# Patient Record
Sex: Female | Born: 1970 | Race: White | Hispanic: No | Marital: Married | Smoking: Never smoker
Health system: Southern US, Community
[De-identification: ages and names within clinical notes are randomized; demographics above are authoritative.]

## PROBLEM LIST (undated history)

## (undated) DIAGNOSIS — K900 Celiac disease: Secondary | ICD-10-CM

## (undated) DIAGNOSIS — G43909 Migraine, unspecified, not intractable, without status migrainosus: Secondary | ICD-10-CM

## (undated) DIAGNOSIS — R42 Dizziness and giddiness: Secondary | ICD-10-CM

## (undated) HISTORY — PX: ABDOMINAL HYSTERECTOMY: SHX81

## (undated) HISTORY — PX: TONSILLECTOMY: SUR1361

---

## 2006-02-28 ENCOUNTER — Inpatient Hospital Stay (HOSPITAL_COMMUNITY): Admission: RE | Admit: 2006-02-28 | Discharge: 2006-03-02 | Payer: Self-pay | Admitting: Obstetrics & Gynecology

## 2007-01-18 ENCOUNTER — Emergency Department: Payer: Self-pay | Admitting: Emergency Medicine

## 2007-01-22 ENCOUNTER — Ambulatory Visit: Payer: Self-pay | Admitting: Internal Medicine

## 2007-01-30 ENCOUNTER — Emergency Department: Payer: Self-pay | Admitting: Emergency Medicine

## 2007-05-30 ENCOUNTER — Encounter: Payer: Self-pay | Admitting: Orthopaedic Surgery

## 2007-06-17 ENCOUNTER — Encounter: Payer: Self-pay | Admitting: Orthopaedic Surgery

## 2007-07-18 ENCOUNTER — Encounter: Payer: Self-pay | Admitting: Orthopaedic Surgery

## 2010-07-06 ENCOUNTER — Ambulatory Visit: Payer: Self-pay | Admitting: Family Medicine

## 2010-10-07 ENCOUNTER — Ambulatory Visit: Payer: Self-pay | Admitting: Otolaryngology

## 2010-12-10 ENCOUNTER — Emergency Department: Payer: Self-pay | Admitting: Internal Medicine

## 2012-09-03 ENCOUNTER — Ambulatory Visit: Payer: Self-pay | Admitting: Family Medicine

## 2018-03-26 ENCOUNTER — Encounter: Payer: Self-pay | Admitting: *Deleted

## 2018-03-26 ENCOUNTER — Other Ambulatory Visit: Payer: Self-pay

## 2018-04-01 NOTE — Discharge Instructions (Signed)
[facility] °[facility] °ENDOSCOPIC SINUS SURGERY °[facility] ° °What is Functional Endoscopic Sinus Surgery? ° The Surgery involves making the natural openings of the sinuses larger by removing the bony partitions that separate the sinuses from the nasal cavity.  The natural sinus lining is preserved as much as possible to allow the sinuses to resume normal function after the surgery.  In some patients nasal polyps (excessively swollen lining of the sinuses) may be removed to relieve obstruction of the sinus openings.  The surgery is performed through the nose using lighted scopes, which eliminates the need for incisions on the face.  A septoplasty is a different procedure which is sometimes performed with sinus surgery.  It involves straightening the boy partition that separates the two sides of your nose.  A crooked or deviated septum may need repair if is obstructing the sinuses or nasal airflow.  Turbinate reduction is also often performed during sinus surgery.  The turbinates are bony proturberances from the side walls of the nose which swell and can obstruct the nose in patients with sinus and allergy problems.  Their size can be surgically reduced to help relieve nasal obstruction. ° °What Can Sinus Surgery Do For Me? ° Sinus surgery can reduce the frequency of sinus infections requiring antibiotic treatment.  This can provide improvement in nasal congestion, post-nasal drainage, facial pressure and nasal obstruction.  Surgery will NOT prevent you from ever having an infection again, so it usually only for patients who get infections 4 or more times yearly requiring antibiotics, or for infections that do not clear with antibiotics.  It will not cure nasal allergies, so patients with allergies may still require medication to treat their allergies after surgery. Surgery may improve headaches related to sinusitis, however, some people will continue to  require medication to control sinus headaches related to allergies.  Surgery will do nothing for other forms of headache (migraine, tension or cluster). ° °What Are the Risks of Endoscopic Sinus Surgery? ° Current techniques allow surgery to be performed safely with little risk, however, there are rare complications that patients should be aware of.  Because the sinuses are located around the eyes, there is risk of eye injury, including blindness, though again, this would be quite rare. This is usually a result of bleeding behind the eye during surgery, which puts the vision oat risk, though there are treatments to protect the vision and prevent permanent disrupted by surgery causing a leak of the spinal fluid that surrounds the brain.  More serious complications would include bleeding inside the brain cavity or damage to the brain.  Again, all of these complications are uncommon, and spinal fluid leaks can be safely managed surgically if they occur.  The most common complication of sinus surgery is bleeding from the nose, which may require packing or cauterization of the nose.  Continued sinus have polyps may experience recurrence of the polyps requiring revision surgery.  Alterations of sense of smell or injury to the tear ducts are also rare complications.  ° °What is the Surgery Like, and what is the Recovery? ° The Surgery usually takes a couple of hours to perform, and is usually performed under a general anesthetic (completely asleep).  Patients are usually discharged home after a couple of hours.  Sometimes during surgery it is necessary to pack the nose to control bleeding, and the packing is left in place for 24 - 48 hours, and removed by your surgeon.    If a septoplasty was performed during the procedure, there is often a splint placed which must be removed after 5-7 days.   °Discomfort: Pain is usually mild to moderate, and can be controlled by prescription pain medication or acetaminophen (Tylenol).   Aspirin, Ibuprofen (Advil, Motrin), or Naprosyn (Aleve) should be avoided, as they can cause increased bleeding.  Most patients feel sinus pressure like they have a bad head cold for several days.  Sleeping with your head elevated can help reduce swelling and facial pressure, as can ice packs over the face.  A humidifier may be helpful to keep the mucous and blood from drying in the nose.  ° °Diet: There are no specific diet restrictions, however, you should generally start with clear liquids and a light diet of bland foods because the anesthetic can cause some nausea.  Advance your diet depending on how your stomach feels.  Taking your pain medication with food will often help reduce stomach upset which pain medications can cause. ° °Nasal Saline Irrigation: It is important to remove blood clots and dried mucous from the nose as it is healing.  This is done by having you irrigate the nose at least 3 - 4 times daily with a salt water solution.  We recommend using NeilMed Sinus Rinse (available at the drug store).  Fill the squeeze bottle with the solution, bend over a sink, and insert the tip of the squeeze bottle into the nose ½ of an inch.  Point the tip of the squeeze bottle towards the inside corner of the eye on the same side your irrigating.  Squeeze the bottle and gently irrigate the nose.  If you bend forward as you do this, most of the fluid will flow back out of the nose, instead of down your throat.   The solution should be warm, near body temperature, when you irrigate.   Each time you irrigate, you should use a full squeeze bottle.  ° °Note that if you are instructed to use Nasal Steroid Sprays at any time after your surgery, irrigate with saline BEFORE using the steroid spray, so you do not wash it all out of the nose. °Another product, Nasal Saline Gel (such as AYR Nasal Saline Gel) can be applied in each nostril 3 - 4 times daily to moisture the nose and reduce scabbing or crusting. ° °Bleeding:   Bloody drainage from the nose can be expected for several days, and patients are instructed to irrigate their nose frequently with salt water to help remove mucous and blood clots.  The drainage may be dark red or brown, though some fresh blood may be seen intermittently, especially after irrigation.  Do not blow you nose, as bleeding may occur. If you must sneeze, keep your mouth open to allow air to escape through your mouth. ° °If heavy bleeding occurs: Irrigate the nose with saline to rinse out clots, then spray the nose 3 - 4 times with Afrin Nasal Decongestant Spray.  The spray will constrict the blood vessels to slow bleeding.  Pinch the lower half of your nose shut to apply pressure, and lay down with your head elevated.  Ice packs over the nose may help as well. If bleeding persists despite these measures, you should notify your doctor.  Do not use the Afrin routinely to control nasal congestion after surgery, as it can result in worsening congestion and may affect healing.  ° ° ° °Activity: Return to work varies among patients. Most patients will be   out of work at least 5 - 7 days to recover.  Patient may return to work after they are off of narcotic pain medication, and feeling well enough to perform the functions of their job.  Patients must avoid heavy lifting (over 10 pounds) or strenuous physical for 2 weeks after surgery, so your employer may need to assign you to light duty, or keep you out of work longer if light duty is not possible.  NOTE: you should not drive, operate dangerous machinery, do any mentally demanding tasks or make any important legal or financial decisions while on narcotic pain medication and recovering from the general anesthetic.  °  °Call Your Doctor Immediately if You Have Any of the Following: °1. Bleeding that you cannot control with the above measures °2. Loss of vision, double vision, bulging of the eye or black eyes. °3. Fever over 101 degrees °4. Neck stiffness with  severe headache, fever, nausea and change in mental state. °You are always encourage to call anytime with concerns, however, please call with requests for pain medication refills during office hours. ° °Office Endoscopy: During follow-up visits your doctor will remove any packing or splints that may have been placed and evaluate and clean your sinuses endoscopically.  Topical anesthetic will be used to make this as comfortable as possible, though you may want to take your pain medication prior to the visit.  How often this will need to be done varies from patient to patient.  After complete recovery from the surgery, you may need follow-up endoscopy from time to time, particularly if there is concern of recurrent infection or nasal polyps. ° ° °General Anesthesia, Adult, Care After °These instructions provide you with information about caring for yourself after your procedure. Your health care provider may also give you more specific instructions. Your treatment has been planned according to current medical practices, but problems sometimes occur. Call your health care provider if you have any problems or questions after your procedure. °What can I expect after the procedure? °After the procedure, it is common to have: °· Vomiting. °· A sore throat. °· Mental slowness. ° °It is common to feel: °· Nauseous. °· Cold or shivery. °· Sleepy. °· Tired. °· Sore or achy, even in parts of your body where you did not have surgery. ° °Follow these instructions at home: °For at least 24 hours after the procedure: °· Do not: °? Participate in activities where you could fall or become injured. °? Drive. °? Use heavy machinery. °? Drink alcohol. °? Take sleeping pills or medicines that cause drowsiness. °? Make important decisions or sign legal documents. °? Take care of children on your own. °· Rest. °Eating and drinking °· If you vomit, drink water, juice, or soup when you can drink without vomiting. °· Drink enough fluid to  keep your urine clear or pale yellow. °· Make sure you have little or no nausea before eating solid foods. °· Follow the diet recommended by your health care provider. °General instructions °· Have a responsible adult stay with you until you are awake and alert. °· Return to your normal activities as told by your health care provider. Ask your health care provider what activities are safe for you. °· Take over-the-counter and prescription medicines only as told by your health care provider. °· If you smoke, do not smoke without supervision. °· Keep all follow-up visits as told by your health care provider. This is important. °Contact a health care provider if: °· You   continue to have nausea or vomiting at home, and medicines are not helpful. °· You cannot drink fluids or start eating again. °· You cannot urinate after 8-12 hours. °· You develop a skin rash. °· You have fever. °· You have increasing redness at the site of your procedure. °Get help right away if: °· You have difficulty breathing. °· You have chest pain. °· You have unexpected bleeding. °· You feel that you are having a life-threatening or urgent problem. °This information is not intended to replace advice given to you by your health care provider. Make sure you discuss any questions you have with your health care provider. °Document Released: 10/09/2000 Document Revised: 12/06/2015 Document Reviewed: 06/17/2015 °Elsevier Interactive Patient Education © 2018 [employer] ° °

## 2018-04-02 ENCOUNTER — Encounter
Admission: RE | Disposition: A | Discharge status: Home / Self Care | Payer: Self-pay | Source: Ambulatory Visit | Attending: Otolaryngology

## 2018-04-02 ENCOUNTER — Ambulatory Visit
Admission: RE | Admit: 2018-04-02 | Discharge: 2018-04-02 | Disposition: A | Discharge status: Home / Self Care | Payer: Managed Care, Other (non HMO) | Source: Ambulatory Visit | Attending: Otolaryngology | Admitting: Otolaryngology

## 2018-04-02 ENCOUNTER — Ambulatory Visit: Payer: Managed Care, Other (non HMO) | Admitting: Anesthesiology

## 2018-04-02 DIAGNOSIS — Z79899 Other long term (current) drug therapy: Secondary | ICD-10-CM | POA: Insufficient documentation

## 2018-04-02 DIAGNOSIS — K900 Celiac disease: Secondary | ICD-10-CM | POA: Insufficient documentation

## 2018-04-02 DIAGNOSIS — K219 Gastro-esophageal reflux disease without esophagitis: Secondary | ICD-10-CM | POA: Diagnosis not present

## 2018-04-02 DIAGNOSIS — E7800 Pure hypercholesterolemia, unspecified: Secondary | ICD-10-CM | POA: Insufficient documentation

## 2018-04-02 DIAGNOSIS — J328 Other chronic sinusitis: Secondary | ICD-10-CM | POA: Diagnosis not present

## 2018-04-02 DIAGNOSIS — B957 Other staphylococcus as the cause of diseases classified elsewhere: Secondary | ICD-10-CM | POA: Insufficient documentation

## 2018-04-02 HISTORY — PX: IMAGE GUIDED SINUS SURGERY: SHX6570

## 2018-04-02 HISTORY — DX: Dizziness and giddiness: R42

## 2018-04-02 HISTORY — DX: Migraine, unspecified, not intractable, without status migrainosus: G43.909

## 2018-04-02 HISTORY — DX: Celiac disease: K90.0

## 2018-04-02 HISTORY — PX: SEPTOPLASTY WITH ETHMOIDECTOMY, AND MAXILLARY ANTROSTOMY: SHX6090

## 2018-04-02 SURGERY — SINUS SURGERY, WITH IMAGING GUIDANCE
Anesthesia: General | Site: Nose | Laterality: Right

## 2018-04-02 MED ORDER — NEOSTIGMINE METHYLSULFATE 10 MG/10ML IV SOLN
INTRAVENOUS | Status: DC | PRN
  Administered 2018-04-02: 3 mg via INTRAVENOUS

## 2018-04-02 MED ORDER — ROCURONIUM BROMIDE 100 MG/10ML IV SOLN
INTRAVENOUS | Status: DC | PRN
  Administered 2018-04-02: 15 mg via INTRAVENOUS

## 2018-04-02 MED ORDER — OXYCODONE HCL 5 MG PO TABS
5.0000 mg | ORAL_TABLET | Freq: Once | ORAL | Status: AC | PRN
  Administered 2018-04-02: 5 mg via ORAL

## 2018-04-02 MED ORDER — LIDOCAINE-EPINEPHRINE 1 %-1:100000 IJ SOLN
INTRAMUSCULAR | Status: DC | PRN
  Administered 2018-04-02: 7 mL

## 2018-04-02 MED ORDER — PROPOFOL 10 MG/ML IV BOLUS
INTRAVENOUS | Status: DC | PRN
  Administered 2018-04-02: 150 mg via INTRAVENOUS

## 2018-04-02 MED ORDER — LIDOCAINE HCL (CARDIAC) PF 100 MG/5ML IV SOSY
PREFILLED_SYRINGE | INTRAVENOUS | Status: DC | PRN
  Administered 2018-04-02: 40 mg via INTRAVENOUS

## 2018-04-02 MED ORDER — DEXAMETHASONE SODIUM PHOSPHATE 4 MG/ML IJ SOLN
INTRAMUSCULAR | Status: DC | PRN
  Administered 2018-04-02: 4 mg via INTRAVENOUS

## 2018-04-02 MED ORDER — ONDANSETRON HCL 4 MG/2ML IJ SOLN
INTRAMUSCULAR | Status: DC | PRN
  Administered 2018-04-02: 4 mg via INTRAVENOUS

## 2018-04-02 MED ORDER — DEXMEDETOMIDINE HCL 200 MCG/2ML IV SOLN
INTRAVENOUS | Status: DC | PRN
  Administered 2018-04-02 (×2): 4 ug via INTRAVENOUS

## 2018-04-02 MED ORDER — SUCCINYLCHOLINE CHLORIDE 20 MG/ML IJ SOLN
INTRAMUSCULAR | Status: DC | PRN
  Administered 2018-04-02: 100 mg via INTRAVENOUS

## 2018-04-02 MED ORDER — ACETAMINOPHEN 160 MG/5ML PO SOLN: 325.0000 mg | ORAL | Status: DC | PRN

## 2018-04-02 MED ORDER — MIDAZOLAM HCL 5 MG/5ML IJ SOLN
INTRAMUSCULAR | Status: DC | PRN
  Administered 2018-04-02: 2 mg via INTRAVENOUS

## 2018-04-02 MED ORDER — OXYMETAZOLINE HCL 0.05 % NA SOLN
NASAL | Status: DC | PRN
  Administered 2018-04-02: 1 via TOPICAL

## 2018-04-02 MED ORDER — ACETAMINOPHEN 325 MG PO TABS: 325.0000 mg | ORAL_TABLET | ORAL | Status: DC | PRN

## 2018-04-02 MED ORDER — GLYCOPYRROLATE 0.2 MG/ML IJ SOLN
INTRAMUSCULAR | Status: DC | PRN
  Administered 2018-04-02: 0.4 mg via INTRAVENOUS
  Administered 2018-04-02: 0.2 mg via INTRAVENOUS

## 2018-04-02 MED ORDER — FENTANYL CITRATE (PF) 100 MCG/2ML IJ SOLN
INTRAMUSCULAR | Status: DC | PRN
  Administered 2018-04-02: 100 ug via INTRAVENOUS

## 2018-04-02 MED ORDER — OXYCODONE HCL 5 MG/5ML PO SOLN: 5.0000 mg | Freq: Once | ORAL | Status: AC | PRN

## 2018-04-02 MED ORDER — PROMETHAZINE HCL 25 MG/ML IJ SOLN
6.2500 mg | INTRAMUSCULAR | Status: DC | PRN
  Administered 2018-04-02: 6.25 mg via INTRAVENOUS

## 2018-04-02 MED ORDER — ACETAMINOPHEN 10 MG/ML IV SOLN
1000.0000 mg | Freq: Once | INTRAVENOUS | Status: AC
  Administered 2018-04-02: 1000 mg via INTRAVENOUS

## 2018-04-02 MED ORDER — FENTANYL CITRATE (PF) 100 MCG/2ML IJ SOLN: 25.0000 ug | INTRAMUSCULAR | Status: DC | PRN

## 2018-04-02 MED ORDER — HYDROCODONE-ACETAMINOPHEN 5-325 MG PO TABS: 1.0000 | ORAL_TABLET | Freq: Four times a day (QID) | ORAL | 0 refills | Status: DC | PRN

## 2018-04-02 MED ORDER — LACTATED RINGERS IV SOLN
10.0000 mL/h | INTRAVENOUS | Status: DC
  Administered 2018-04-02: 09:00:00 via INTRAVENOUS
  Administered 2018-04-02: 10 mL/h via INTRAVENOUS
  Administered 2018-04-02: 08:00:00 via INTRAVENOUS

## 2018-04-02 SURGICAL SUPPLY — 21 items
BATTERY INSTRU NAVIGATION (MISCELLANEOUS) ×14 IMPLANT
BTRY SRG DRVR LF (MISCELLANEOUS) ×6
CANISTER SUCT 1200ML W/VALVE (MISCELLANEOUS) ×4 IMPLANT
COAG SUCT 10F 3.5MM HAND CTRL (MISCELLANEOUS) ×4 IMPLANT
DRAPE HEAD BAR (DRAPES) ×4 IMPLANT
ELECT REM PT RETURN 9FT ADLT (ELECTROSURGICAL) ×4
ELECTRODE REM PT RTRN 9FT ADLT (ELECTROSURGICAL) ×2 IMPLANT
IV NS 500ML (IV SOLUTION) ×4
IV NS 500ML BAXH (IV SOLUTION) ×2 IMPLANT
KIT TURNOVER KIT A (KITS) ×4 IMPLANT
NS IRRIG 500ML POUR BTL (IV SOLUTION) ×4 IMPLANT
PACK DRAPE NASAL/ENT (PACKS) ×4 IMPLANT
PACKING NASAL EPIS 4X2.4 XEROG (MISCELLANEOUS) ×2 IMPLANT
PATTIES SURGICAL .5 X3 (DISPOSABLE) ×4 IMPLANT
SHAVER DIEGO BLD STD TYPE A (BLADE) ×4 IMPLANT
SOL ANTI-FOG 6CC FOG-OUT (MISCELLANEOUS) ×2 IMPLANT
SOL FOG-OUT ANTI-FOG 6CC (MISCELLANEOUS) ×2
SYR 10ML LL (SYRINGE) ×4 IMPLANT
TRACKER CRANIALMASK (MASK) ×4 IMPLANT
TUBING DECLOG MULTIDEBRIDER (TUBING) ×4 IMPLANT
WATER STERILE IRR 500ML POUR (IV SOLUTION) IMPLANT

## 2018-04-02 NOTE — Anesthesia Postprocedure Evaluation (Signed)
Anesthesia Post Note  Patient: Toni Flores  Procedure(s) Performed: IMAGE GUIDED SINUS SURGERY (Right ) RIGHT  ETHMOIDECTOMY, AND MAXILLARY ANTROSTOMY WITH TISSUE AND RIGHT FRONTAL SINUSOTMOMY (Right Nose)  Patient location during evaluation: PACU Anesthesia Type: General Level of consciousness: awake Pain management: pain level controlled Vital Signs Assessment: post-procedure vital signs reviewed and stable Respiratory status: respiratory function stable Cardiovascular status: stable Postop Assessment: no signs of nausea or vomiting Anesthetic complications: no    Jola BabinskiElsje Arkin Imran

## 2018-04-02 NOTE — Op Note (Signed)
04/02/2018  9:52 AM    Toni Flores  [id number]   Pre-Op Diagnosis:  RIGHT CHRONIC SINUSITIS  Post-op Diagnosis: RIGHT CHRONIC SINUSITIS  Procedure:  1)  Image Guided Sinus Surgery,   2)  Right Endoscopic Maxillary Antrostomy with Tissue Removal   3)  Right Frontal Sinusotomy   4)  Right anterior Ethmoidectomy      Surgeon:  Toni Flores  Anesthesia:  General endotracheal  EBL:  75cc  Complications:  None  Findings: Purulence in the right maxillary sinus with mucosal thickening in the anterior ethmoids and frontal recess  Procedure: After the patient was identified in holding and the benefits of the procedure were reviewed as well as the consent and risks, the patient was taken to the operating room and with the patient in a comfortable supine position,  general orotracheal anesthesia was induced without difficulty.  A proper time-out was performed.  The Stryker image guidance system was set up and calibrated in the normal fashion and felt to be acceptable.  Next 1% Xylocaine with 1:100,000 epinephrine was infiltrated into the uncinate region and  anterior middle turbinate on the right.  Several minutes were allowed for this to take effect.  Cottoniod pledgets soaked in Afrin were placed into both nasal cavities and left while the patient was prepped and draped in the standard fashion. The image guided suction was calibrated and used to inspect known points in the nasal cavity to assess accuracy of the image guided system. Accuracy was felt to be excellent.   The right middle turbinate was noted to be widened and on careful inspection of the CT there appeared to be a small opacified concha bullosa. This was opened with a sickle knife and the lateral wall excised with endoscopic scissors and use of the microdebrider. The middle turbinate was then medialized with a Freer and the uncinate process then resected with through-cutting forceps as well as the microdebrider. In this  fashion the uncinate was completely removed along with soft tissue and bone of the medial wall of the maxillary sinus to create a large patent maxillary antrostomy. The left maxillary sinus was suctioned to clear purulent secretions. Cultures were taken.  Next the right anterior ethmoid sinuses were dissected beginning inferomedially, entering the ethmoid bulla. Thru cut forceps were used to open the anterior ethmoids. The microdebrider was used as needed to trim loose mucosal edges.  Dissection proceeded anteriorly and superiorly into the right frontal recess which was dissected utilizing a 30 and then a 70 scope and frontal curved instruments. The curved image guided suction was used during this dissection to frequently reassess the anatomy on the CT scan. The frontal recess was dissected until a suction could be passed up into the region of the frontal sinus.   The nose was suctioned and inspected. The maxillary sinus was irrigated with saline. Xerogel absorbable sinus packing was then placed in the right ethmoid cavity.   The patient was then returned to the anesthesiologist for awakening and taken to recovery room in good condition postoperatively.  Disposition:   PACU and d/c home  Plan: Ice, elevation, narcotic analgesia and prophylactic antibiotics. Begin sinus irrigations with saline tomrrow, irrigating 3-4 times daily. Return to the office in 7 days.  Return to work in 7-10 days, no strenuous activities for two weeks.   Toni Flores 04/02/2018 9:52 AM

## 2018-04-02 NOTE — Anesthesia Procedure Notes (Addendum)
Procedure Name: Intubation Date/Time: 04/02/2018 8:18 AM Performed by: Lind Guest, CRNA Pre-anesthesia Checklist: Patient identified, Emergency Drugs available, Suction available, Patient being monitored and Timeout performed Patient Re-evaluated:Patient Re-evaluated prior to induction Oxygen Delivery Method: Circle system utilized Preoxygenation: Pre-oxygenation with 100% oxygen Induction Type: IV induction Ventilation: Mask ventilation without difficulty Laryngoscope Size: Mac and 3 Grade View: Grade I Tube type: Oral Rae Tube size: 7.0 mm Number of attempts: 1 Airway Equipment and Method: LTA kit utilized Placement Confirmation: ETT inserted through vocal cords under direct vision,  positive ETCO2 and breath sounds checked- equal and bilateral Tube secured with: Tape Dental Injury: Teeth and Oropharynx as per pre-operative assessment

## 2018-04-02 NOTE — Anesthesia Preprocedure Evaluation (Addendum)
Anesthesia Evaluation  Patient identified by MRN, date of birth, ID band Patient awake    Reviewed: Allergy & Precautions, NPO status , Patient's Chart, lab work & pertinent test results  Airway Mallampati: II  TM Distance: >3 FB     Dental no notable dental hx.    Pulmonary neg recent URI,    breath sounds clear to auscultation       Cardiovascular negative cardio ROS   Rhythm:Regular Rate:Normal     Neuro/Psych  Headaches,    GI/Hepatic Celiac disease   Endo/Other  negative endocrine ROS  Renal/GU      Musculoskeletal   Abdominal   Peds  Hematology   Anesthesia Other Findings Chronic sinitis  Reproductive/Obstetrics                            Anesthesia Physical Anesthesia Plan  ASA: II  Anesthesia Plan: General   Post-op Pain Management:    Induction: Intravenous  PONV Risk Score and Plan: 3 and Ondansetron, Dexamethasone and Midazolam  Airway Management Planned: Oral ETT  Additional Equipment:   Intra-op Plan:   Post-operative Plan:   Informed Consent: I have reviewed the patients History and Physical, chart, labs and discussed the procedure including the risks, benefits and alternatives for the proposed anesthesia with the patient or authorized representative who has indicated his/her understanding and acceptance.   Dental advisory given  Plan Discussed with: CRNA  Anesthesia Plan Comments:         Anesthesia Quick Evaluation

## 2018-04-02 NOTE — Transfer of Care (Signed)
Immediate Anesthesia Transfer of Care Note  Patient: Toni Flores  Procedure(s) Performed: IMAGE GUIDED SINUS SURGERY (Right ) RIGHT  ETHMOIDECTOMY, AND MAXILLARY ANTROSTOMY WITH TISSUE AND RIGHT FRONTAL SINUSOTMOMY (Right Nose)  Patient Location: PACU  Anesthesia Type: General  Level of Consciousness: awake, alert  and patient cooperative  Airway and Oxygen Therapy: Patient Spontanous Breathing and Patient connected to supplemental oxygen  Post-op Assessment: Post-op Vital signs reviewed, Patient's Cardiovascular Status Stable, Respiratory Function Stable, Patent Airway and No signs of Nausea or vomiting  Post-op Vital Signs: Reviewed and stable  Complications: No apparent anesthesia complications

## 2018-04-02 NOTE — H&P (Signed)
History and physical reviewed and will be scanned in later. No change in medical status reported by the patient or family, appears stable for surgery. All questions regarding the procedure answered, and patient (or family if a child) expressed understanding of the procedure. ? ?Toni Flores ?@TODAY@ ?

## 2018-04-03 ENCOUNTER — Encounter: Payer: Self-pay | Admitting: Otolaryngology

## 2018-04-06 LAB — AEROBIC/ANAEROBIC CULTURE (SURGICAL/DEEP WOUND)

## 2018-04-06 LAB — AEROBIC/ANAEROBIC CULTURE W GRAM STAIN (SURGICAL/DEEP WOUND)

## 2018-04-06 LAB — SURGICAL PATHOLOGY

## 2019-03-11 ENCOUNTER — Other Ambulatory Visit: Payer: Self-pay | Admitting: Student

## 2019-03-11 DIAGNOSIS — R1013 Epigastric pain: Secondary | ICD-10-CM

## 2019-03-20 ENCOUNTER — Encounter
Admission: RE | Admit: 2019-03-20 | Discharge: 2019-03-20 | Disposition: A | Discharge status: Home / Self Care | Payer: Managed Care, Other (non HMO) | Source: Ambulatory Visit | Attending: Student | Admitting: Student

## 2019-03-20 ENCOUNTER — Other Ambulatory Visit: Payer: Self-pay

## 2019-03-20 DIAGNOSIS — R1013 Epigastric pain: Secondary | ICD-10-CM | POA: Diagnosis not present

## 2019-03-20 MED ORDER — TECHNETIUM TC 99M MEBROFENIN IV KIT
5.3800 | PACK | Freq: Once | INTRAVENOUS | Status: AC | PRN
  Administered 2019-03-20: 09:00:00 5.38 via INTRAVENOUS

## 2019-08-19 ENCOUNTER — Other Ambulatory Visit: Payer: Self-pay | Admitting: Student

## 2019-08-19 DIAGNOSIS — R1013 Epigastric pain: Secondary | ICD-10-CM

## 2019-08-19 DIAGNOSIS — R109 Unspecified abdominal pain: Secondary | ICD-10-CM

## 2019-08-19 DIAGNOSIS — R6881 Early satiety: Secondary | ICD-10-CM

## 2020-09-13 ENCOUNTER — Telehealth: Payer: Self-pay

## 2020-09-13 NOTE — Telephone Encounter (Signed)
Attempted to call and schedule ENT REFERRAL. No answer had to leave vm.

## 2020-09-16 ENCOUNTER — Other Ambulatory Visit: Payer: Self-pay

## 2020-09-16 ENCOUNTER — Encounter: Payer: Self-pay | Admitting: Infectious Diseases

## 2020-09-16 ENCOUNTER — Ambulatory Visit: Payer: Managed Care, Other (non HMO) | Attending: Infectious Diseases | Admitting: Infectious Diseases

## 2020-09-16 ENCOUNTER — Other Ambulatory Visit
Admission: RE | Admit: 2020-09-16 | Discharge: 2020-09-16 | Disposition: A | Discharge status: Home / Self Care | Payer: Managed Care, Other (non HMO) | Attending: Infectious Diseases | Admitting: Infectious Diseases

## 2020-09-16 VITALS — BP 117/78 | HR 98 | Temp 97.3°F | Resp 16 | Ht 60.0 in | Wt 142.0 lb

## 2020-09-16 DIAGNOSIS — J0100 Acute maxillary sinusitis, unspecified: Secondary | ICD-10-CM | POA: Diagnosis present

## 2020-09-16 DIAGNOSIS — Z79899 Other long term (current) drug therapy: Secondary | ICD-10-CM | POA: Insufficient documentation

## 2020-09-16 DIAGNOSIS — K8681 Exocrine pancreatic insufficiency: Secondary | ICD-10-CM | POA: Insufficient documentation

## 2020-09-16 DIAGNOSIS — K900 Celiac disease: Secondary | ICD-10-CM | POA: Insufficient documentation

## 2020-09-16 DIAGNOSIS — Z8719 Personal history of other diseases of the digestive system: Secondary | ICD-10-CM | POA: Diagnosis not present

## 2020-09-16 DIAGNOSIS — R1110 Vomiting, unspecified: Secondary | ICD-10-CM | POA: Diagnosis not present

## 2020-09-16 DIAGNOSIS — Z881 Allergy status to other antibiotic agents status: Secondary | ICD-10-CM | POA: Diagnosis not present

## 2020-09-16 DIAGNOSIS — Z8616 Personal history of COVID-19: Secondary | ICD-10-CM | POA: Diagnosis not present

## 2020-09-16 LAB — SEDIMENTATION RATE: Sed Rate: 15 mm/hr (ref 0–30)

## 2020-09-16 LAB — COMPREHENSIVE METABOLIC PANEL
ALT: 11 U/L (ref 0–44)
AST: 18 U/L (ref 15–41)
Albumin: 4.1 g/dL (ref 3.5–5.0)
Alkaline Phosphatase: 54 U/L (ref 38–126)
Anion gap: 6 (ref 5–15)
BUN: 10 mg/dL (ref 6–20)
CO2: 20 mmol/L — ABNORMAL LOW (ref 22–32)
Calcium: 8.9 mg/dL (ref 8.9–10.3)
Chloride: 114 mmol/L — ABNORMAL HIGH (ref 98–111)
Creatinine, Ser: 0.71 mg/dL (ref 0.44–1.00)
GFR, Estimated: >60 mL/min (ref >60)
Glucose, Bld: 98 mg/dL (ref 70–99)
Potassium: 3.4 mmol/L — ABNORMAL LOW (ref 3.5–5.1)
Sodium: 140 mmol/L (ref 135–145)
Total Bilirubin: 0.3 mg/dL (ref 0.3–1.2)
Total Protein: 7.4 g/dL (ref 6.5–8.1)

## 2020-09-16 LAB — CBC WITH DIFFERENTIAL/PLATELET
Abs Immature Granulocytes: 0.01 10*3/uL (ref 0.00–0.07)
Basophils Absolute: 0 10*3/uL (ref 0.0–0.1)
Basophils Relative: 1 %
Eosinophils Absolute: 0.2 10*3/uL (ref 0.0–0.5)
Eosinophils Relative: 5 %
HCT: 36.5 % (ref 36.0–46.0)
Hemoglobin: 11.7 g/dL — ABNORMAL LOW (ref 12.0–15.0)
Immature Granulocytes: 0 %
Lymphocytes Relative: 32 %
Lymphs Abs: 1.6 10*3/uL (ref 0.7–4.0)
MCH: 29.7 pg (ref 26.0–34.0)
MCHC: 32.1 g/dL (ref 30.0–36.0)
MCV: 92.6 fL (ref 80.0–100.0)
Monocytes Absolute: 0.4 10*3/uL (ref 0.1–1.0)
Monocytes Relative: 7 %
Neutro Abs: 2.7 10*3/uL (ref 1.7–7.7)
Neutrophils Relative %: 55 %
Platelets: 279 10*3/uL (ref 150–400)
RBC: 3.94 MIL/uL (ref 3.87–5.11)
RDW: 13.7 % (ref 11.5–15.5)
WBC: 4.9 10*3/uL (ref 4.0–10.5)
nRBC: 0 % (ref 0.0–0.2)

## 2020-09-16 LAB — C-REACTIVE PROTEIN: CRP: 0.6 mg/dL (ref <1.0)

## 2020-09-16 NOTE — Patient Instructions (Addendum)
You are here referred by ENT for sinusitis and need for antibiotics-  you have GI issues and cannot take oral antibiotics. Will decide regarding Iv antibiotics after CT scan on Monday. Discussed with Dr.Bennett regarding the plan- will do labs today- ESR/CRP/CBC/CMP. Will discuss with your pcp regarding low IgM

## 2020-09-16 NOTE — Progress Notes (Signed)
NAME: Toni Flores  DOB: Jul 12, 1971  MRN: [id number]  Date/Time: 09/16/2020 12:47 PM  Subjective:   ?Patient is referred by Dr. Richardson Landry for sinusitis of the left side and need for IV antibiotics. Toni Flores is a 50 y.o. female with a history of celiac disease, Barrett's esophagus migraine, low IgM, Covid infection  in 2020 and 2021, right chronic sinusitis s/p right endoscopic maxillary and ostomy, right frontal sinusotomy, right anterior ethmoidectomy on 04/02/2018 presents with left-sided facial pain. As per patient she had COVID in July 2021.  She works as a Marine scientist at [employer]. Post Covid she was not able to go to work for 3 months because she was having frequent sinusitis and bronchitis.  She received 2 courses of steroids.  The last one was in October 2021.  In January 2022 she developed pain in the upper molar on the left side.  She was initially Flores amoxicillin and ibuprofen.  3 days into the treatment she developed vomiting with blood in it and dark stools.   And then the tooth was extracted in February 2022.  Since then she has had pain on the left side of her face especially the maxillary area. She also seems to have postnasal drip. At times greenish discharge from the nasal cavity. She saw ENT Dr. Richardson Landry and underwent a culture which was only staph epidermidis cyst skin organism.  Because of her intolerance to oral antibiotics she was Flores 3 doses of ceftriaxone.  She is referred to me for further management.  A CT scan is being requested. She saw GI Dr. Alice Toni Flores on 09/08/2020 and she is on Carafate, proton pump inhibitor, Zofran, Phenergan. Her sister who was in her 60s had CVID and she passed away due to Covid last year She also has low IgM level.  Her PCP Dr. Sharol Flores has been checking her levels. She has no fever    Past Medical History:  Diagnosis Date   Celiac disease    Migraine headache    approx 1x/mo   Vertigo    last episode July 2019    Past Surgical  History:  Procedure Laterality Date   ABDOMINAL HYSTERECTOMY     CESAREAN SECTION     x2   IMAGE GUIDED SINUS SURGERY Right 04/02/2018   Procedure: IMAGE GUIDED SINUS SURGERY;  Surgeon: Clyde Canterbury, MD;  Location: [facility];  Service: ENT;  Laterality: Right;  NEED STRYKER DISK  DISK Flores TO [facility] 9.12.19   SEPTOPLASTY WITH ETHMOIDECTOMY, AND MAXILLARY ANTROSTOMY Right 04/02/2018   Procedure: RIGHT  ETHMOIDECTOMY, AND MAXILLARY ANTROSTOMY WITH TISSUE AND RIGHT FRONTAL SINUSOTMOMY;  Surgeon: Clyde Canterbury, MD;  Location: [facility];  Service: ENT;  Laterality: Right;  Latex sensitivity   TONSILLECTOMY      Social History   Socioeconomic History   Marital status: Married    Spouse name: Not on file   Number of children: Not on file   Years of education: Not on file   Highest education level: Not on file  Occupational History   Not on file  Tobacco Use   Smoking status: Never Smoker   Smokeless tobacco: Never Used  Vaping Use   Vaping Use: Never used  Substance and Sexual Activity   Alcohol use: Yes    Alcohol/week: 2.0 standard drinks    Types: 2 Cans of beer per week   Drug use: Not on file   Sexual activity: Not on file  Other Topics Concern   Not on  file  Social History Narrative   Not on file   Social Determinants of Health   Financial Resource Strain: Not on file  Food Insecurity: Not on file  Transportation Needs: Not on file  Physical Activity: Not on file  Stress: Not on file  Social Connections: Not on file  Intimate Partner Violence: Not on file    No family history on file. Allergies  Allergen Reactions   Orphenadrine Citrate Other (See Comments)    Urinary retention.   Erythromycin Hives   Gluten Meal     GI issues   Keflex [Cephalexin] Hives   Levaquin [Levofloxacin]     Muscle aches   Norflex [Orphenadrine]     Urinary retention   Adhesive [Tape] Rash    (paper tape OK)   Latex Rash    Extended  wear of gloves   I? Current Outpatient Medications  Medication Sig Dispense Refill   acetaZOLAMIDE (DIAMOX) 500 MG capsule Take 1,000 mg by mouth 2 (two) times daily.     cyclobenzaprine (FLEXERIL) 10 MG tablet Take 10 mg by mouth 3 (three) times daily as needed for muscle spasms. Pt usually only uses at bedtime     diazepam (VALIUM) 5 MG tablet Take 5 mg by mouth as needed (dizziness).     ondansetron (ZOFRAN) 4 MG tablet Take 4 mg by mouth every 8 (eight) hours as needed for nausea or vomiting.     promethazine (PHENERGAN) 25 MG tablet Take 25 mg by mouth every 6 (six) hours as needed for nausea or vomiting.     traZODone (DESYREL) 100 MG tablet Take 100 mg by mouth at bedtime.     No current facility-administered medications for this visit.     Abtx:  Anti-infectives (From admission, onward)   None      REVIEW OF SYSTEMS:  Const: negative fever,  chills, negative weight loss Eyes: negative diplopia or visual changes, negative eye pain ENT: As above resp: negative cough, hemoptysis, dyspnea Cards: negative for chest pain, palpitations, lower extremity edema GU: negative for frequency, dysuria and hematuria GI: Negative for abdominal pain, diarrhea, bleeding, constipation Skin: negative for rash and pruritus Heme: negative for easy bruising and gum/nose bleeding MS: Left knee pain Neurolo: Has headaches, no dizziness, vertigo, memory problems  Psych: anxiety, depression  Endocrine: negative for thyroid, diabetes Allergy/Immunology- negative for any medication or food allergies ? Pertinent Positives include : Objective:  VITALS:  BP 117/78    Pulse 98    Temp (!) 97.3 F (36.3 C) (Oral)    Resp 16    Ht 5' (1.524 m)    Wt 142 lb (64.4 kg)    SpO2 98%    BMI 27.73 kg/m  PHYSICAL EXAM:  General: Alert, cooperative, no distress, appears stated age.  Head: Normocephalic, without obvious abnormality, atraumatic. Eyes: Conjunctivae clear, anicteric sclerae. Pupils are  equal ENT.  Tenderness over the maxillary area on the left side Left ear examination does not look normal. No tenderness. Lips, mucosa, and tongue normal. No Thrush Neck: Supple, symmetrical, no adenopathy, thyroid: non tender no carotid bruit and no JVD. Back: No CVA tenderness. Lungs: Clear to auscultation bilaterally. No Wheezing or Rhonchi. No rales. Heart: Regular rate and rhythm, no murmur, rub or gallop. Abdomen: Soft, non-tender,not distended. Bowel sounds normal. No masses Extremities: atraumatic, no cyanosis. No edema. No clubbing Skin: No rashes or lesions. Or bruising Lymph: Cervical, supraclavicular normal. Neurologic: Grossly non-focal Pertinent Labs Lab Results CBC    Component  Value Date/Time   WBC 4.9 09/16/2020 1054   RBC 3.94 09/16/2020 1054   HGB 11.7 (L) 09/16/2020 1054   HCT 36.5 09/16/2020 1054   PLT 279 09/16/2020 1054   MCV 92.6 09/16/2020 1054   MCH 29.7 09/16/2020 1054   MCHC 32.1 09/16/2020 1054   RDW 13.7 09/16/2020 1054   LYMPHSABS 1.6 09/16/2020 1054   MONOABS 0.4 09/16/2020 1054   EOSABS 0.2 09/16/2020 1054   BASOSABS 0.0 09/16/2020 1054    CMP Latest Ref Rng & Units 09/16/2020  Glucose 70 - 99 mg/dL 98  BUN 6 - 20 mg/dL 10  Creatinine 0.44 - 1.00 mg/dL 0.71  Sodium 135 - 145 mmol/L 140  Potassium 3.5 - 5.1 mmol/L 3.4(L)  Chloride 98 - 111 mmol/L 114(H)  CO2 22 - 32 mmol/L 20(L)  Calcium 8.9 - 10.3 mg/dL 8.9  Total Protein 6.5 - 8.1 g/dL 7.4  Total Bilirubin 0.3 - 1.2 mg/dL 0.3  Alkaline Phos 38 - 126 U/L 54  AST 15 - 41 U/L 18  ALT 0 - 44 U/L 11   Micro 09/08/2020 Staff epidermidis IMAGING RESULTS: None available ? Impression/Recommendation ? ?Sinusitis on the left side.  Patient needs to get a CT scan to assess the degree of involvement of all the sinuses. She had COVID last year and has had 2 courses of steroids following that. She is intolerant to oral antibiotics because of severe vomiting. She has been referred to me for  IV antibiotics. We will decide on antibiotics after the CAT scan.  She may need surgical intervention depending on the severity of the sinuses  History of CVI D in sister who died of Covid last year Her immunoglobulin levels have been checked by her PCP and IgM is low.  She may need IVIG  Recent severe gastritis due to NSAID leading to GI bleed Currently on PPI, sucralfate, Zofran and Phenergan  History of celiac disease  History of migraine  History of Barrett's disease ?  Discussed the management with patient in great detail and with Dr. Richardson Landry. We will reach out to her PCP as well. Today we will get baseline CBC, CMP, ESR and CRP. ________________________________________ Note:  This document was prepared using Dragon voice recognition software and may include unintentional dictation errors.

## 2020-09-21 ENCOUNTER — Encounter: Payer: Self-pay | Admitting: Infectious Diseases

## 2020-09-21 ENCOUNTER — Ambulatory Visit
Admission: RE | Admit: 2020-09-21 | Discharge: 2020-09-21 | Disposition: A | Discharge status: Home / Self Care | Payer: Self-pay | Source: Ambulatory Visit | Attending: Infectious Diseases | Admitting: Infectious Diseases

## 2020-09-21 ENCOUNTER — Ambulatory Visit
Admission: RE | Admit: 2020-09-21 | Discharge: 2020-09-21 | Disposition: A | Discharge status: Home / Self Care | Payer: Managed Care, Other (non HMO) | Source: Ambulatory Visit | Attending: Infectious Diseases | Admitting: Infectious Diseases

## 2020-09-21 ENCOUNTER — Other Ambulatory Visit: Payer: Self-pay

## 2020-09-21 ENCOUNTER — Ambulatory Visit: Payer: Managed Care, Other (non HMO) | Attending: Infectious Diseases | Admitting: Infectious Diseases

## 2020-09-21 DIAGNOSIS — J0100 Acute maxillary sinusitis, unspecified: Secondary | ICD-10-CM | POA: Insufficient documentation

## 2020-09-21 DIAGNOSIS — Z452 Encounter for adjustment and management of vascular access device: Secondary | ICD-10-CM | POA: Insufficient documentation

## 2020-09-21 DIAGNOSIS — Z881 Allergy status to other antibiotic agents status: Secondary | ICD-10-CM | POA: Insufficient documentation

## 2020-09-21 MED ORDER — SODIUM CHLORIDE 0.9% FLUSH: 10.0000 mL | INTRAVENOUS | Status: DC | PRN

## 2020-09-21 MED ORDER — CHLORHEXIDINE GLUCONATE CLOTH 2 % EX PADS: 6.0000 | MEDICATED_PAD | Freq: Every day | CUTANEOUS | Status: DC

## 2020-09-21 MED ORDER — SODIUM CHLORIDE 0.9 % IV SOLN
3.0000 g | Freq: Once | INTRAVENOUS | Status: AC
  Administered 2020-09-21: 3 g via INTRAVENOUS
  Filled 2020-09-21: qty 3

## 2020-09-21 MED ORDER — SODIUM CHLORIDE 0.9% FLUSH: 10.0000 mL | Freq: Two times a day (BID) | INTRAVENOUS | Status: DC

## 2020-09-21 MED ORDER — HEPARIN SOD (PORK) LOCK FLUSH 100 UNIT/ML IV SOLN
250.0000 [IU] | Freq: Every day | INTRAVENOUS | Status: DC
  Administered 2020-09-21: 250 [IU]

## 2020-09-21 MED ORDER — HEPARIN SOD (PORK) LOCK FLUSH 100 UNIT/ML IV SOLN: 250.0000 [IU] | INTRAVENOUS | Status: DC | PRN

## 2020-09-21 NOTE — Progress Notes (Signed)
The purpose of this virtual visit is to provide medical care while limiting exposure to the novel coronavirus (COVID19) for both patient and office staff.   Consent was obtained for phone visit:  Yes.   Answered questions that patient had about telehealth interaction:  Yes.   I discussed the limitations, risks, security and privacy concerns of performing an evaluation and management service by telephone. I also discussed with the patient that there may be a patient responsible charge related to this service. The patient expressed understanding and agreed to proceed.   Patient Location: Home Provider Location:office Only patient and provider were on the call Pt has acute maxillary sinusitis following tooth infection in Jan 2022 GI intolerance She had  CT scan done at Dr.Bennett's office and reported by him as left maxillary sinus with moderate mucoperiosteal thickening and narrow osteomeatal unit She is getting a PICC line and starting unasyn 3 grams Iv q 6 today . She cannot take PO antibiotics because of Gi intolerance of late Will  evaluate in a week Discussed with patient in detail. Discussed with day surgery for PICC placement and also with [facility] Spoke with Dr.Bennett about the care plan. If antibiotics are not working within 2weeks will need antrostomy  Total time spent on the phone with patient 5 min Total time spent coordinating care 20 min  IV antibiotic Unasyn 3 grams IV every 6 hrs until 10/05/20 CBC/CMP every monday while on antibiotics Fax results to Dr.Ravishankar Promptly Follow up 1 week by phone

## 2020-09-21 NOTE — Progress Notes (Signed)
Peripherally Inserted Central Catheter Placement  The IV Nurse has discussed with the patient and/or persons authorized to consent for the patient, the purpose of this procedure and the potential benefits and risks involved with this procedure.  The benefits include less needle sticks, lab draws from the catheter, and the patient may be discharged home with the catheter. Risks include, but not limited to, infection, bleeding, blood clot (thrombus formation), and puncture of an artery; nerve damage and irregular heartbeat and possibility to perform a PICC exchange if needed/ordered by physician.  Alternatives to this procedure were also discussed.  Bard Power PICC patient education guide, fact sheet on infection prevention and patient information card has been provided to patient /or left at bedside.    PICC Placement Documentation  PICC Single Lumen 09/21/20 PICC Right Basilic 35 cm 1 cm (Active)  Indication for Insertion or Continuance of Line Home intravenous therapies (PICC only) 09/21/20 1413  Exposed Catheter (cm) 1 cm 09/21/20 1413  Site Assessment Clean;Dry;Intact 09/21/20 1413  Line Status Flushed 09/21/20 1413  Dressing Type Transparent 09/21/20 1413  Dressing Status Clean;Dry;Intact 09/21/20 1413  Antimicrobial disc in place? Yes 09/21/20 1413  Dressing Intervention New dressing;Other (Comment) 09/21/20 1413  Dressing Change Due 09/28/20 09/21/20 1413       Reginia Forts Albarece 09/21/2020, 2:14 PM

## 2020-09-30 ENCOUNTER — Ambulatory Visit: Payer: Managed Care, Other (non HMO) | Attending: Infectious Diseases | Admitting: Infectious Diseases

## 2020-09-30 ENCOUNTER — Other Ambulatory Visit: Payer: Self-pay

## 2020-09-30 DIAGNOSIS — Z792 Long term (current) use of antibiotics: Secondary | ICD-10-CM | POA: Insufficient documentation

## 2020-09-30 DIAGNOSIS — J018 Other acute sinusitis: Secondary | ICD-10-CM | POA: Diagnosis not present

## 2020-09-30 DIAGNOSIS — J0100 Acute maxillary sinusitis, unspecified: Secondary | ICD-10-CM | POA: Diagnosis present

## 2020-09-30 NOTE — Progress Notes (Signed)
The purpose of this virtual visit is to provide medical care while limiting exposure to the novel coronavirus (COVID19) for both patient and office staff.   Consent was obtained for phone visit:  Yes.   Answered questions that patient had about telehealth interaction:  Yes.   I discussed the limitations, risks, security and privacy concerns of performing an evaluation and management service by telephone. I also discussed with the patient that there may be a patient responsible charge related to this service. The patient expressed understanding and agreed to proceed.   Patient Location: Home Provider Location:office Only the provider and patient in the visit Pt with subacute sinusuits left maxiall since her tooth extraction in Jan 2022 was started on unasyn last week- follow up phone call to see how she has been doing Pt says the pain on the face, congestion , has much improved- she still has sensitivity of the tooth to cold but no pain She has no side effects of unasyn- she does it every 6 hrs-100% adherent Plan Will continue IV unasyn for total of 18 days/ We will see her on 10/12/2020. She has a follow-up with ENT next week.   Discussed the management with the patient in great detail. Total time spent 5 minutes Will follow her on

## 2020-10-04 ENCOUNTER — Telehealth: Payer: Self-pay

## 2020-10-04 NOTE — Telephone Encounter (Signed)
Received two call regarding patient's IV antibiotics.  First call from patient requesting orders for IV antibiotics. States home health does not have any orders to continue antibiotics. Today was her last dose per RN. Second call from [facility] stating patient called them stating her IV antibiotics were extended. Home health will need new orders called in if today is not last dose. Home health would like to confirm end date. Will forward message to MD to advise on antibiotic plan.

## 2020-10-04 NOTE — Telephone Encounter (Signed)
Hi, I just saw this message and I called [facility] and they cannot send antibiotics as it is too late- Please in the future secure chat me as I dont see regular messages till I ma done with my work. thx

## 2020-10-12 ENCOUNTER — Ambulatory Visit: Payer: Managed Care, Other (non HMO) | Attending: Infectious Diseases | Admitting: Infectious Diseases

## 2020-10-12 ENCOUNTER — Telehealth: Payer: Self-pay

## 2020-10-12 ENCOUNTER — Other Ambulatory Visit: Payer: Self-pay

## 2020-10-12 DIAGNOSIS — Z8719 Personal history of other diseases of the digestive system: Secondary | ICD-10-CM | POA: Insufficient documentation

## 2020-10-12 DIAGNOSIS — Z452 Encounter for adjustment and management of vascular access device: Secondary | ICD-10-CM | POA: Diagnosis not present

## 2020-10-12 DIAGNOSIS — J0100 Acute maxillary sinusitis, unspecified: Secondary | ICD-10-CM | POA: Insufficient documentation

## 2020-10-12 DIAGNOSIS — K900 Celiac disease: Secondary | ICD-10-CM | POA: Insufficient documentation

## 2020-10-12 NOTE — Progress Notes (Signed)
The purpose of this virtual visit is to provide medical care while limiting exposure to the novel coronavirus (COVID19) for both patient and office staff.   Consent was obtained for phone visit:  Yes.   Answered questions that patient had about telehealth interaction:  Yes.   I discussed the limitations, risks, security and privacy concerns of performing an evaluation and management service by telephone. I also discussed with the patient that there may be a patient responsible charge related to this service. The patient expressed understanding and agreed to proceed.   Patient Location: Home Provider Location:office Only patient and provider in the visit This is a follow up visit for completion of IIV antibiotics. -Patient was initially referred to me by Dr. Willeen Cass for sinusitis following a tooth extraction on the left side. On 09/21/2020 she had a PICC line placed at the day surgery and IV Unasyn was started.  The initial plan was to give her a 14 days of Unasyn and then it was continued for another week and today she is finishing 3 weeks of total IV Unasyn.   - She has no facial pain, no nasal discharge, no headache and is doing well.  No fever or chills  Past medical history  celiac disease Migraine  Physical examination could not be done  Impression/recommendation Acute maxillary sinusitis following tooth infection in January 2022. GI intolerance to antibiotics. Started on IV Unasyn on 09/21/2020.  Completing 3 weeks of antibiotics.  Now she is totally symptom free.  We will remove the PICC line today  History of CVI D in sister who died of Covid last year  Her immunoglobulin levels have been checked by her PCP and IgM is low she will follow-up with him  Recent severe gastritis due to NSAID leading to GI bleed. Currently on PPI sucralfate, Zofran  History of celiac disease.   History of migraine  History of Barrett's disease  Discussed the management with the patient in detail  follow-up as needed.  We will inform the home health agency to remove the PICC line.  Total time spent on the phone 5 minutes.

## 2020-10-12 NOTE — Telephone Encounter (Signed)
I have spoke with Whitney with [facility] [phone] in [place] in regards to patient IV antibiotics. Per Dr. Rivka Safer patient is to complete IV antibiotics today and picc can be pulled after last dose.  I have given Whitney verbal orders for picc to be pulled after last dose today. Whitney verbalized understanding. Park Beck T Pricilla Loveless

## 2020-10-18 ENCOUNTER — Telehealth: Payer: Self-pay

## 2020-10-18 NOTE — Telephone Encounter (Signed)
She completed antibiotic last Tuesday. So when did the nurse go to her house to pull the PICC?

## 2020-10-18 NOTE — Telephone Encounter (Signed)
Toni Flores with [facility] called to report Toni Flores pulled her own PICC after completing IV therapy.  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  The Home Health nurse went out for a visit and dicovered it was removed.  Nurse states the site looked fine.  Laurell Josephs, RN

## 2021-07-10 IMAGING — NM NM HEPATO W/GB/PHARM/[PERSON_NAME]
2 series · 12 of 12 positions shown · non-contrast
Comparison: None

CLINICAL DATA: Nausea and vomiting off and on for 2 months, history
irritable bowel syndrome, GERD

EXAM:
NUCLEAR MEDICINE HEPATOBILIARY IMAGING WITH GALLBLADDER EF
TECHNIQUE: Sequential images of the abdomen were obtained [DATE] minutes
following intravenous administration of radiopharmaceutical. After
oral ingestion of Ensure, gallbladder ejection fraction was
determined. At 60 min, normal ejection fraction is greater than 33%.
RADIOPHARMACEUTICALS:  5.38 mCi 4c-KKm  Choletec IV

[Series 1000: hepatobiliary scan · 9.59mm/px · 6 of 60 frames shown]
[frame 6/60]
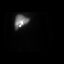
[frame 16/60]
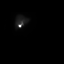
[frame 26/60]
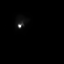
[frame 36/60]
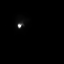
[frame 46/60]
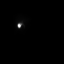
[frame 56/60]
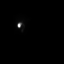

[Series 1000: gallbladder ef · 4.80mm/px · 6 of 120 frames shown]
[frame 11/120]
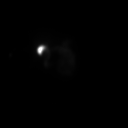
[frame 31/120]
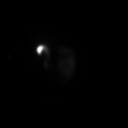
[frame 51/120]
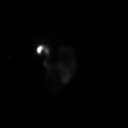
[frame 71/120]
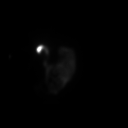
[frame 91/120]
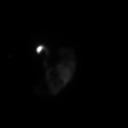
[frame 111/120]
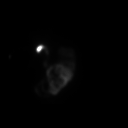

[12 of 12 positions shown; findings below may reference images not displayed]

FINDINGS: Normal tracer extraction from bloodstream indicating normal
hepatocellular function.

Normal excretion of tracer into biliary tree.

Gallbladder visualized at 7 min.

Small bowel visualized at 53 min.

No hepatic retention of tracer.

Subjectively normal emptying of tracer from gallbladder following
fatty meal stimulation.

Calculated gallbladder ejection fraction is 63%, normal.

Patient reported no symptoms following Ensure ingestion.

Normal gallbladder ejection fraction following Ensure ingestion is
greater than 33% at 1 hour.
IMPRESSION: Patent biliary tree with normal gallbladder ejection fraction of 63%
following fatty meal stimulation.

## 2021-08-03 ENCOUNTER — Ambulatory Visit
Admission: EM | Admit: 2021-08-03 | Discharge: 2021-08-03 | Disposition: A | Discharge status: Home / Self Care | Payer: Managed Care, Other (non HMO) | Attending: Emergency Medicine | Admitting: Emergency Medicine

## 2021-08-03 ENCOUNTER — Other Ambulatory Visit: Payer: Self-pay

## 2021-08-03 DIAGNOSIS — B029 Zoster without complications: Secondary | ICD-10-CM

## 2021-08-03 MED ORDER — VALACYCLOVIR HCL 1 G PO TABS: 1000.0000 mg | ORAL_TABLET | Freq: Three times a day (TID) | ORAL | 0 refills | Status: AC

## 2021-08-03 MED ORDER — GABAPENTIN 600 MG PO TABS: 600.0000 mg | ORAL_TABLET | Freq: Every day | ORAL | 1 refills | Status: AC

## 2021-08-03 NOTE — ED Provider Notes (Signed)
[facility]    CSN: [id number] Arrival date & time: 08/03/21  1812      History   Chief Complaint Chief Complaint  Patient presents with   Rash    HPI Toni Flores is a 51 y.o. female.   HPI  51 year old female here for evaluation of rash.  Patient reports that she developed a painful burning rash in her right mid low back this afternoon while at work.  She states that this is very similar to when she had shingles 20 years ago.  She states that she had a friend look at it at work, she works in a long-term care facility, who stated that looks like shingles to her.  Past Medical History:  Diagnosis Date   Celiac disease    Migraine headache    approx 1x/mo   Vertigo    last episode July 2019    There are no problems to display for this patient.   Past Surgical History:  Procedure Laterality Date   ABDOMINAL HYSTERECTOMY     CESAREAN SECTION     x2   IMAGE GUIDED SINUS SURGERY Right 04/02/2018   Procedure: IMAGE GUIDED SINUS SURGERY;  Surgeon: Geanie Logan, MD;  Location: [facility];  Service: ENT;  Laterality: Right;  NEED STRYKER DISK  DISK GIVEN TO [facility] 9.12.19   SEPTOPLASTY WITH ETHMOIDECTOMY, AND MAXILLARY ANTROSTOMY Right 04/02/2018   Procedure: RIGHT  ETHMOIDECTOMY, AND MAXILLARY ANTROSTOMY WITH TISSUE AND RIGHT FRONTAL SINUSOTMOMY;  Surgeon: Geanie Logan, MD;  Location: [facility];  Service: ENT;  Laterality: Right;  Latex sensitivity   TONSILLECTOMY      OB History   No obstetric history on file.      Home Medications    Prior to Admission medications   Medication Sig Start Date End Date Taking? Authorizing Provider  acetaZOLAMIDE (DIAMOX) 500 MG capsule Take 1,000 mg by mouth 2 (two) times daily.   Yes [provider]  cyclobenzaprine (FLEXERIL) 10 MG tablet Take 10 mg by mouth 3 (three) times daily as needed for muscle spasms. Pt usually only uses at bedtime   Yes [provider]  diazepam  (VALIUM) 5 MG tablet Take 5 mg by mouth as needed (dizziness).   Yes [provider]  gabapentin (NEURONTIN) 600 MG tablet Take 1 tablet (600 mg total) by mouth at bedtime. 08/03/21  Yes Becky Augusta, NP  ondansetron (ZOFRAN) 4 MG tablet Take 4 mg by mouth every 8 (eight) hours as needed for nausea or vomiting.   Yes [provider]  promethazine (PHENERGAN) 25 MG tablet Take 25 mg by mouth every 6 (six) hours as needed for nausea or vomiting.   Yes [provider]  traZODone (DESYREL) 100 MG tablet Take 100 mg by mouth at bedtime.   Yes [provider]  valACYclovir (VALTREX) 1000 MG tablet Take 1 tablet (1,000 mg total) by mouth 3 (three) times daily. 08/03/21  Yes Becky Augusta, NP    Family History No family history on file.  Social History Social History   Tobacco Use   Smoking status: Never   Smokeless tobacco: Never  Vaping Use   Vaping Use: Never used  Substance Use Topics   Alcohol use: Yes    Alcohol/week: 2.0 standard drinks    Types: 2 Cans of beer per week   Drug use: Never     Allergies   Orphenadrine citrate, Erythromycin, Gluten meal, Keflex [cephalexin], Levaquin [levofloxacin], Norflex [orphenadrine], Adhesive [tape], and Latex  Review of Systems Review of Systems  Skin:  Positive for rash.  Hematological: Negative.   Psychiatric/Behavioral: Negative.      Physical Exam Triage Vital Signs ED Triage Vitals  Enc Vitals Group     BP 08/03/21 1843 117/81     Pulse Rate 08/03/21 1843 85     Resp 08/03/21 1843 16     Temp 08/03/21 1843 98.1 F (36.7 C)     Temp Source 08/03/21 1843 Oral     SpO2 08/03/21 1843 100 %     Weight 08/03/21 1844 145 lb (65.8 kg)     Height 08/03/21 1844 5\' 1"  (1.549 m)     Head Circumference --      Peak Flow --      Pain Score 08/03/21 1844 5     Pain Loc --      Pain Edu? --      Excl. in GC? --    No data found.  Updated Vital Signs BP 117/81 (BP Location: Right Arm)    Pulse 85     Temp 98.1 F (36.7 C) (Oral)    Resp 16    Ht 5\' 1"  (1.549 m)    Wt 145 lb (65.8 kg)    SpO2 100%    BMI 27.40 kg/m   Visual Acuity Right Eye Distance:   Left Eye Distance:   Bilateral Distance:    Right Eye Near:   Left Eye Near:    Bilateral Near:     Physical Exam Vitals and nursing note reviewed.  Constitutional:      Appearance: Normal appearance.  HENT:     Head: Normocephalic and atraumatic.  Skin:    Capillary Refill: Capillary refill takes less than 2 seconds.     Findings: Erythema and rash present.  Neurological:     General: No focal deficit present.     Mental Status: She is alert and oriented to person, place, and time.  Psychiatric:        Mood and Affect: Mood normal.        Behavior: Behavior normal.        Thought Content: Thought content normal.        Judgment: Judgment normal.     UC Treatments / Results  Labs (all labs ordered are listed, but only abnormal results are displayed) Labs Reviewed - No data to display  EKG   Radiology No results found.  Procedures Procedures (including critical care time)  Medications Ordered in UC Medications - No data to display  Initial Impression / Assessment and Plan / UC Course  I have reviewed the triage vital signs and the nursing notes.  Pertinent labs & imaging results that were available during my care of the patient were reviewed by me and considered in my medical decision making (see chart for details).  Patient is very pleasant, nontoxic-appearing 51 year old female here for evaluation of rash in her right mid back that started today while at work that she describes as burning and painful.  On exam patient has a cluster of erythematous maculopapular lesions at the level of L3.  There are no vesicles at present.  The exam is consistent with shingles.  I will treat patient with Valtrex 3 times daily x7 days.  I have also prescribed gabapentin 600 mg for use at bedtime to help with nerve pain.   Patient was advised by her employer that she cannot come back to work until her lesions had dried up  and scabbed over.  I have advised her that she does not have any vesicles at present and none may develop given that she is starting the Valtrex so early in her course.  I have however given her a work note.   Final Clinical Impressions(s) / UC Diagnoses   Final diagnoses:  Herpes zoster without complication     Discharge Instructions      Take the Valtrex 3 times a day for 7 days.  Take the Gabapentin at bedtime for nerve pain.  Keep the lesions covered and avoid contact with anyone who is pregnant, unvaccinated, or immunocompromised.      ED Prescriptions     Medication Sig Dispense Auth. Provider   valACYclovir (VALTREX) 1000 MG tablet Take 1 tablet (1,000 mg total) by mouth 3 (three) times daily. 21 tablet Becky Augusta, NP   gabapentin (NEURONTIN) 600 MG tablet Take 1 tablet (600 mg total) by mouth at bedtime. 30 tablet Becky Augusta, NP      PDMP not reviewed this encounter.   Becky Augusta, NP 08/03/21 2018

## 2021-08-03 NOTE — Discharge Instructions (Addendum)
Take the Valtrex 3 times a day for 7 days.  Take the Gabapentin at bedtime for nerve pain.  Keep the lesions covered and avoid contact with anyone who is pregnant, unvaccinated, or immunocompromised.

## 2021-08-03 NOTE — ED Triage Notes (Signed)
Pt reports rash to right mid back, painful and burning. Thinks it is shingles. Dx with shingles 20+ years ago. At this time, rash has not radiated, started today while at work.  No new lotions, soaps, detergents, foods, or medications,

## 2022-09-25 ENCOUNTER — Ambulatory Visit (INDEPENDENT_AMBULATORY_CARE_PROVIDER_SITE_OTHER): Payer: 59

## 2022-09-25 DIAGNOSIS — K6389 Other specified diseases of intestine: Secondary | ICD-10-CM | POA: Diagnosis not present

## 2022-09-25 DIAGNOSIS — Z1211 Encounter for screening for malignant neoplasm of colon: Secondary | ICD-10-CM | POA: Diagnosis present

## 2022-09-25 DIAGNOSIS — K640 First degree hemorrhoids: Secondary | ICD-10-CM | POA: Diagnosis not present

## 2022-09-25 DIAGNOSIS — K2100 Gastro-esophageal reflux disease with esophagitis, without bleeding: Secondary | ICD-10-CM | POA: Diagnosis not present

## 2022-09-25 DIAGNOSIS — Z83719 Family history of colon polyps, unspecified: Secondary | ICD-10-CM | POA: Diagnosis not present

## 2022-09-25 DIAGNOSIS — K449 Diaphragmatic hernia without obstruction or gangrene: Secondary | ICD-10-CM | POA: Diagnosis not present
